# Patient Record
Sex: Male | Born: 1975 | Race: Black or African American | Hispanic: No | Marital: Married | Smoking: Former smoker
Health system: Southern US, Community
[De-identification: ages and names within clinical notes are randomized; demographics above are authoritative.]

## PROBLEM LIST (undated history)

## (undated) DIAGNOSIS — Z789 Other specified health status: Secondary | ICD-10-CM

## (undated) HISTORY — PX: VASECTOMY: SHX75

## (undated) HISTORY — DX: Other specified health status: Z78.9

---

## 2007-08-21 ENCOUNTER — Ambulatory Visit: Payer: Self-pay | Admitting: Internal Medicine

## 2007-08-21 DIAGNOSIS — R109 Unspecified abdominal pain: Secondary | ICD-10-CM

## 2007-08-21 LAB — CONVERTED CEMR LAB
ALT: 16 units/L (ref 0–53)
Albumin: 3.9 g/dL (ref 3.5–5.2)
Basophils Absolute: 0 10*3/uL (ref 0.0–0.1)
Basophils Relative: 0.6 % (ref 0.0–3.0)
CO2: 28 meq/L (ref 19–32)
Calcium: 9.2 mg/dL (ref 8.4–10.5)
Cholesterol: 167 mg/dL (ref 0–200)
Creatinine, Ser: 1.1 mg/dL (ref 0.4–1.5)
Glucose, Bld: 92 mg/dL (ref 70–99)
HCT: 48.3 % (ref 39.0–52.0)
Hemoglobin: 16.7 g/dL (ref 13.0–17.0)
Ketones, ur: NEGATIVE mg/dL
Lymphocytes Relative: 28.7 % (ref 12.0–46.0)
MCHC: 34.5 g/dL (ref 30.0–36.0)
Monocytes Absolute: 0.6 10*3/uL (ref 0.1–1.0)
Monocytes Relative: 10.6 % (ref 3.0–12.0)
Neutro Abs: 3.4 10*3/uL (ref 1.4–7.7)
RBC: 5.02 M/uL (ref 4.22–5.81)
Specific Gravity, Urine: >=1.03 (ref 1.000–1.03)
TSH: 0.88 microintl units/mL (ref 0.35–5.50)
Total CHOL/HDL Ratio: 3.5
Total Protein, Urine: NEGATIVE mg/dL
Total Protein: 6.9 g/dL (ref 6.0–8.3)
Triglycerides: 39 mg/dL (ref 0–149)
pH: 5.5 (ref 5.0–8.0)

## 2007-08-22 LAB — CONVERTED CEMR LAB: Chlamydia, DNA Probe: POSITIVE — AB

## 2008-05-19 ENCOUNTER — Telehealth: Payer: Self-pay | Admitting: Gastroenterology

## 2011-06-08 ENCOUNTER — Other Ambulatory Visit: Payer: Self-pay | Admitting: Student

## 2011-06-08 ENCOUNTER — Ambulatory Visit
Admission: RE | Admit: 2011-06-08 | Discharge: 2011-06-08 | Disposition: A | Discharge status: Home / Self Care | Payer: No Typology Code available for payment source | Source: Ambulatory Visit | Attending: Student | Admitting: Student

## 2011-06-08 DIAGNOSIS — R7611 Nonspecific reaction to tuberculin skin test without active tuberculosis: Secondary | ICD-10-CM

## 2013-05-25 ENCOUNTER — Other Ambulatory Visit (HOSPITAL_COMMUNITY): Payer: Self-pay | Admitting: Family Medicine

## 2013-05-25 DIAGNOSIS — R519 Headache, unspecified: Secondary | ICD-10-CM

## 2013-05-25 DIAGNOSIS — R51 Headache: Principal | ICD-10-CM

## 2013-05-26 IMAGING — CR DG CHEST 2V
2 series · 2 of 2 positions shown · non-contrast
Comparison: None

CLINICAL DATA: Positive PPD

CHEST - 2 VIEW

[w chest pa]
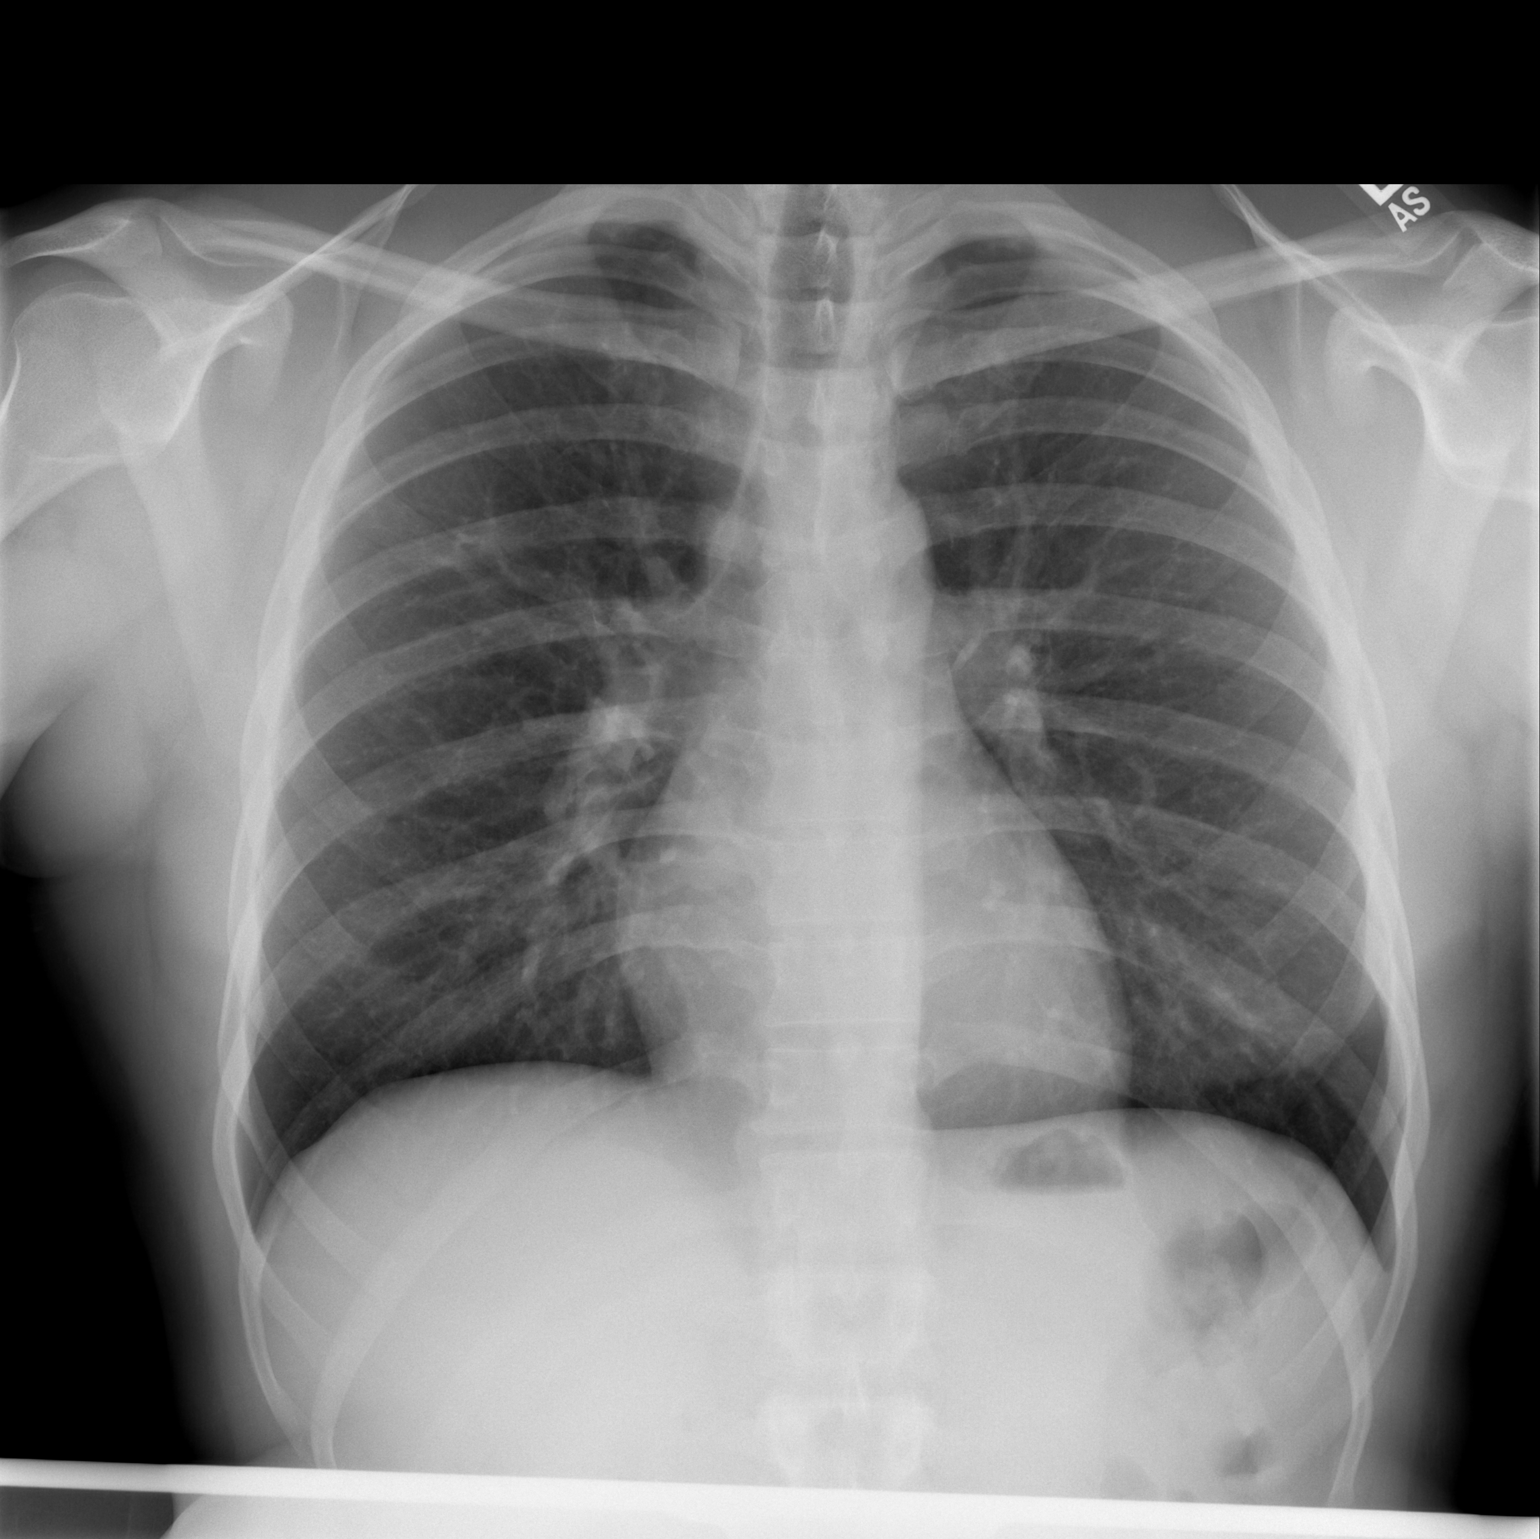

[w chest lat]
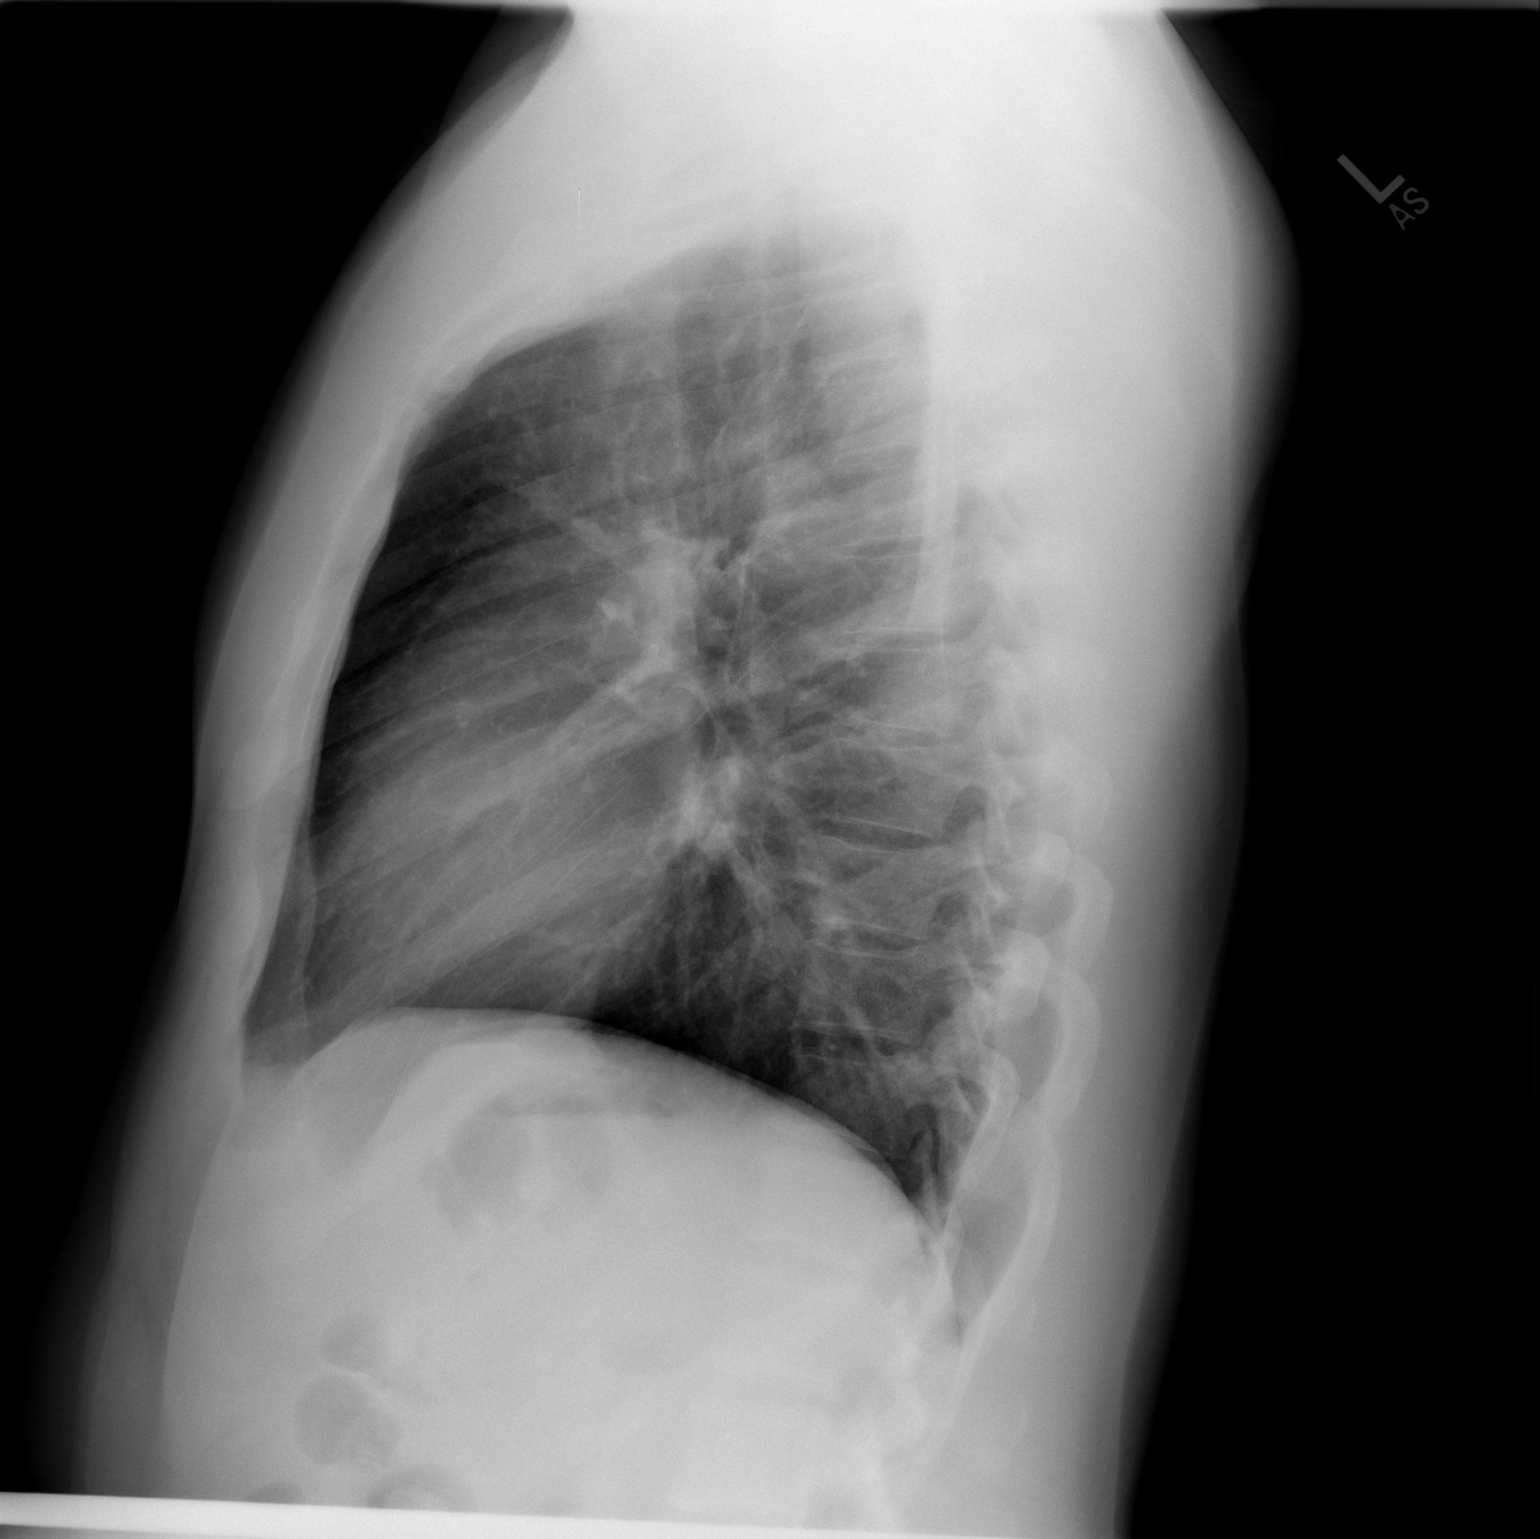

[2 of 2 positions shown; findings below may reference images not displayed]

FINDINGS: The heart size and mediastinal contours are within normal
limits.  Both lungs are clear.  The visualized skeletal structures
are unremarkable.
IMPRESSION: Negative exam.

## 2013-05-27 ENCOUNTER — Ambulatory Visit (HOSPITAL_COMMUNITY)
Admission: RE | Admit: 2013-05-27 | Discharge: 2013-05-27 | Disposition: A | Discharge status: Home / Self Care | Payer: BC Managed Care – PPO | Source: Ambulatory Visit | Attending: Family Medicine | Admitting: Family Medicine

## 2013-05-27 DIAGNOSIS — R42 Dizziness and giddiness: Secondary | ICD-10-CM | POA: Insufficient documentation

## 2013-05-27 DIAGNOSIS — R519 Headache, unspecified: Secondary | ICD-10-CM

## 2013-05-27 DIAGNOSIS — H538 Other visual disturbances: Secondary | ICD-10-CM | POA: Insufficient documentation

## 2013-05-27 DIAGNOSIS — R51 Headache: Secondary | ICD-10-CM | POA: Insufficient documentation

## 2018-09-16 ENCOUNTER — Telehealth: Payer: Self-pay | Admitting: Family Medicine

## 2018-09-16 ENCOUNTER — Other Ambulatory Visit: Payer: Self-pay | Admitting: *Deleted

## 2018-09-16 DIAGNOSIS — Z20822 Contact with and (suspected) exposure to covid-19: Secondary | ICD-10-CM

## 2018-09-16 NOTE — Telephone Encounter (Signed)
Pt states is going for covid 19 testing. States his son in the household has tested positive. Questioning if he should return to work; advised to self isolate until test resulted. Pt verbalizes understanding.

## 2018-09-17 LAB — NOVEL CORONAVIRUS, NAA: SARS-CoV-2, NAA: NOT DETECTED

## 2018-09-18 ENCOUNTER — Other Ambulatory Visit: Payer: Self-pay

## 2018-09-18 DIAGNOSIS — Z20822 Contact with and (suspected) exposure to covid-19: Secondary | ICD-10-CM

## 2018-09-19 LAB — NOVEL CORONAVIRUS, NAA: SARS-CoV-2, NAA: NOT DETECTED

## 2018-09-24 ENCOUNTER — Other Ambulatory Visit: Payer: Self-pay

## 2018-09-24 DIAGNOSIS — Z20822 Contact with and (suspected) exposure to covid-19: Secondary | ICD-10-CM

## 2018-09-25 LAB — NOVEL CORONAVIRUS, NAA: SARS-CoV-2, NAA: NOT DETECTED

## 2018-11-04 ENCOUNTER — Other Ambulatory Visit: Payer: Self-pay

## 2018-11-04 DIAGNOSIS — Z20822 Contact with and (suspected) exposure to covid-19: Secondary | ICD-10-CM

## 2018-11-05 LAB — NOVEL CORONAVIRUS, NAA: SARS-CoV-2, NAA: DETECTED — AB

## 2018-11-13 ENCOUNTER — Other Ambulatory Visit: Payer: Self-pay | Admitting: *Deleted

## 2018-11-13 DIAGNOSIS — Z20822 Contact with and (suspected) exposure to covid-19: Secondary | ICD-10-CM

## 2018-11-14 LAB — NOVEL CORONAVIRUS, NAA: SARS-CoV-2, NAA: DETECTED — AB

## 2021-05-22 DIAGNOSIS — Z6828 Body mass index (BMI) 28.0-28.9, adult: Secondary | ICD-10-CM | POA: Diagnosis not present

## 2021-05-22 DIAGNOSIS — E785 Hyperlipidemia, unspecified: Secondary | ICD-10-CM | POA: Diagnosis not present

## 2021-05-22 DIAGNOSIS — K625 Hemorrhage of anus and rectum: Secondary | ICD-10-CM | POA: Diagnosis not present

## 2021-05-22 DIAGNOSIS — Z125 Encounter for screening for malignant neoplasm of prostate: Secondary | ICD-10-CM | POA: Diagnosis not present

## 2021-05-22 DIAGNOSIS — K649 Unspecified hemorrhoids: Secondary | ICD-10-CM | POA: Diagnosis not present

## 2021-06-08 ENCOUNTER — Ambulatory Visit: Payer: No Typology Code available for payment source | Admitting: Gastroenterology

## 2021-07-17 DIAGNOSIS — K649 Unspecified hemorrhoids: Secondary | ICD-10-CM | POA: Diagnosis not present

## 2021-07-17 DIAGNOSIS — E785 Hyperlipidemia, unspecified: Secondary | ICD-10-CM | POA: Diagnosis not present

## 2021-07-17 DIAGNOSIS — K625 Hemorrhage of anus and rectum: Secondary | ICD-10-CM | POA: Diagnosis not present

## 2022-05-21 DIAGNOSIS — E7800 Pure hypercholesterolemia, unspecified: Secondary | ICD-10-CM | POA: Diagnosis not present

## 2022-05-21 DIAGNOSIS — K649 Unspecified hemorrhoids: Secondary | ICD-10-CM | POA: Diagnosis not present

## 2022-06-01 ENCOUNTER — Encounter (INDEPENDENT_AMBULATORY_CARE_PROVIDER_SITE_OTHER): Payer: Self-pay | Admitting: *Deleted

## 2022-06-05 ENCOUNTER — Ambulatory Visit: Payer: No Typology Code available for payment source | Admitting: Gastroenterology

## 2022-06-06 ENCOUNTER — Encounter: Payer: Self-pay | Admitting: *Deleted

## 2022-06-06 ENCOUNTER — Encounter: Payer: Self-pay | Admitting: Gastroenterology

## 2022-06-06 ENCOUNTER — Ambulatory Visit: Payer: No Typology Code available for payment source | Admitting: Gastroenterology

## 2022-06-06 VITALS — BP 132/88 | HR 65 | Temp 98.4°F | Ht 70.5 in | Wt 200.5 lb

## 2022-06-06 DIAGNOSIS — Z1211 Encounter for screening for malignant neoplasm of colon: Secondary | ICD-10-CM | POA: Insufficient documentation

## 2022-06-06 DIAGNOSIS — K648 Other hemorrhoids: Secondary | ICD-10-CM

## 2022-06-06 NOTE — Progress Notes (Signed)
Gastroenterology Office Note    Referring Provider: Mirna Mires, MD Primary Care Physician:  Mirna Mires, MD  Primary GI: Dr. Marletta Lor    Chief Complaint   Chief Complaint  Patient presents with   Blood In Stools    Patient here today due to seeing bright red blood in stools. He has seen the blood as recent as two days ago. Patient states he has a history of Hemorrhoids.     History of Present Illness   Connor Wong is a 47 y.o. male presenting today at the request of Mirna Mires, MD due to long-standing hemorrhoids.    History of hemorrhoids throughout his life. 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 Over a year. Painless. No constipation currently. BM every other day is his baseline. Productive stools. Prolonged toilet time. No straining. No abdominal pain. No weight loss or lack of appetite.    Past Medical History:  Diagnosis Date   Medical history non-contributory     Past Surgical History:  Procedure Laterality Date   VASECTOMY      Current Outpatient Medications  Medication Sig Dispense Refill   Multiple Vitamin (MULTIVITAMIN) capsule Take 1 capsule by mouth daily.     OVER THE COUNTER MEDICATION Black seed oil two times a week.   Tumeric once two times a week.     No current facility-administered medications for this visit.    Allergies as of 06/06/2022 - Review Complete 06/06/2022  Allergen Reaction Noted   Penicillins  06/06/2022    Family History  Problem Relation Age of Onset   Healthy Father    Colon cancer Neg Hx    Colon polyps Neg Hx     Social History   Socioeconomic History   Marital status: Married    Spouse name: Not on file   Number of children: Not on file   Years of education: Not on file   Highest education level: Not on file  Occupational History   Not on file  Tobacco Use   Smoking status: Former    Types:  Cigarettes   Smokeless tobacco: Never  Vaping Use   Vaping Use: Every day  Substance and Sexual Activity   Alcohol use: Yes    Comment: occasional   Drug use: Never   Sexual activity: Not on file  Other Topics Concern   Not on file  Social History Narrative   Not on file   Social Determinants of Health   Financial Resource Strain: Not on file  Food Insecurity: Not on file  Transportation Needs: Not on file  Physical Activity: Not on file  Stress: Not on file  Social Connections: Not on file  Intimate Partner Violence: Not on file     Review of Systems   Gen: Denies any fever, chills, fatigue, weight loss, lack of appetite.  CV: Denies chest pain, heart palpitations, peripheral edema, syncope.  Resp: Denies shortness of breath at rest or with exertion. Denies wheezing or cough.  GI: Denies dysphagia or odynophagia. Denies jaundice, hematemesis, fecal incontinence. GU : Denies urinary burning, urinary frequency, urinary hesitancy MS: Denies joint pain, muscle weakness, cramps, or limitation of movement.  Derm: Denies rash, itching, dry skin Psych: Denies depression, anxiety, memory loss, and confusion Heme: Denies bruising, bleeding, and enlarged lymph nodes.   Physical Exam   BP 132/88 (BP Location: Left Arm, Patient Position: Sitting, Cuff  Size: Normal)   Pulse 65   Temp 98.4 F (36.9 C) (Temporal)   Ht 5' 10.5" (1.791 m)   Wt 200 lb 8 oz (90.9 kg)   BMI 28.36 kg/m  General:   Alert and oriented. Pleasant and cooperative. Well-nourished and well-developed.  Head:  Normocephalic and atraumatic. Eyes:  Without icterus Ears:  Normal auditory acuity. Lungs:  Clear to auscultation bilaterally.  Heart:  S1, S2 present without murmurs appreciated.  Abdomen:  +BS, soft, non-tender and non-distended. No HSM noted. No guarding or rebound. No masses appreciated.  Rectal:  without mass. Prominent internal hemorrhoid columns. No pain.  Msk:  Symmetrical without gross  deformities. Normal posture. Extremities:  Without edema. Neurologic:  Alert and  oriented x4;  grossly normal neurologically. Skin:  Intact without significant lesions or rashes. Psych:  Alert and cooperative. Normal mood and affect.   Assessment   Connor Wong is a 46 y.o. male in great health who is presenting in consultation at request of Dr. Loleta Chance due to chronic hemorrhoids.   He has had long-standing symptoms attributed to hemorrhoids, with painless intermittent low-volume hematochezia over the past year. Rectal exam today with likely internal hemorrhoids; no anoscopy performed. No mass.  He will be due regardless for screening colonoscopy, as he turns 47 in a few days. No FH colon cancer or polyps. We will arrange colonoscopy in the near future.  I briefly discussed hemorrhoid banding with him. He is unsure if he wants to pursue this.    PLAN   Proceed with colonoscopy by Dr. Marletta Lor  in near future: the risks, benefits, and alternatives have been discussed with the patient in detail. The patient states understanding and desires to proceed. ASA 2  Avoid prolonged toilet time. Continue to avoid straining  Consider hemorrhoid banding thereafter  Gelene Mink, PhD, ANP-BC [facility]

## 2022-06-06 NOTE — Patient Instructions (Signed)
We are arranging a colonoscopy with Dr. Marletta Lor in the near future!  Avoid sitting for more than 2-3 minutes on the toilet. Avoid straining.  We can consider hemorrhoid banding in the future if you are interested in this!  Happy Birthday!  It was a pleasure to see you today. I want to create trusting relationships with patients and provide genuine, compassionate, and quality care. I truly value your feedback, so please be on the lookout for a survey regarding your visit with me today. I appreciate your time in completing this!    Gelene Mink, PhD, ANP-BC [facility]

## 2022-06-06 NOTE — H&P (View-Only) (Signed)
  Gastroenterology Office Note    Referring Provider: Hill, Gerald, MD Primary Care Physician:  Hill, Gerald, MD  Primary GI: Dr. Carver    Chief Complaint   Chief Complaint  Patient presents with   Blood In Stools    Patient here today due to seeing bright red blood in stools. He has seen the blood as recent as two days ago. Patient states he has a history of Hemorrhoids.     History of Present Illness   Connor Wong is a 46 y.o. male presenting today at the request of Hill, Gerald, MD due to long-standing hemorrhoids.    History of hemorrhoids throughout his life. 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 Over a year. Painless. No constipation currently. BM every other day is his baseline. Productive stools. Prolonged toilet time. No straining. No abdominal pain. No weight loss or lack of appetite.    Past Medical History:  Diagnosis Date   Medical history non-contributory     Past Surgical History:  Procedure Laterality Date   VASECTOMY      Current Outpatient Medications  Medication Sig Dispense Refill   Multiple Vitamin (MULTIVITAMIN) capsule Take 1 capsule by mouth daily.     OVER THE COUNTER MEDICATION Black seed oil two times a week.   Tumeric once two times a week.     No current facility-administered medications for this visit.    Allergies as of 06/06/2022 - Review Complete 06/06/2022  Allergen Reaction Noted   Penicillins  06/06/2022    Family History  Problem Relation Age of Onset   Healthy Father    Colon cancer Neg Hx    Colon polyps Neg Hx     Social History   Socioeconomic History   Marital status: Married    Spouse name: Not on file   Number of children: Not on file   Years of education: Not on file   Highest education level: Not on file  Occupational History   Not on file  Tobacco Use   Smoking status: Former    Types:  Cigarettes   Smokeless tobacco: Never  Vaping Use   Vaping Use: Every day  Substance and Sexual Activity   Alcohol use: Yes    Comment: occasional   Drug use: Never   Sexual activity: Not on file  Other Topics Concern   Not on file  Social History Narrative   Not on file   Social Determinants of Health   Financial Resource Strain: Not on file  Food Insecurity: Not on file  Transportation Needs: Not on file  Physical Activity: Not on file  Stress: Not on file  Social Connections: Not on file  Intimate Partner Violence: Not on file     Review of Systems   Gen: Denies any fever, chills, fatigue, weight loss, lack of appetite.  CV: Denies chest pain, heart palpitations, peripheral edema, syncope.  Resp: Denies shortness of breath at rest or with exertion. Denies wheezing or cough.  GI: Denies dysphagia or odynophagia. Denies jaundice, hematemesis, fecal incontinence. GU : Denies urinary burning, urinary frequency, urinary hesitancy MS: Denies joint pain, muscle weakness, cramps, or limitation of movement.  Derm: Denies rash, itching, dry skin Psych: Denies depression, anxiety, memory loss, and confusion Heme: Denies bruising, bleeding, and enlarged lymph nodes.   Physical Exam   BP 132/88 (BP Location: Left Arm, Patient Position: Sitting, Cuff   Size: Normal)   Pulse 65   Temp 98.4 F (36.9 C) (Temporal)   Ht 5' 10.5" (1.791 m)   Wt 200 lb 8 oz (90.9 kg)   BMI 28.36 kg/m  General:   Alert and oriented. Pleasant and cooperative. Well-nourished and well-developed.  Head:  Normocephalic and atraumatic. Eyes:  Without icterus Ears:  Normal auditory acuity. Lungs:  Clear to auscultation bilaterally.  Heart:  S1, S2 present without murmurs appreciated.  Abdomen:  +BS, soft, non-tender and non-distended. No HSM noted. No guarding or rebound. No masses appreciated.  Rectal:  without mass. Prominent internal hemorrhoid columns. No pain.  Msk:  Symmetrical without gross  deformities. Normal posture. Extremities:  Without edema. Neurologic:  Alert and  oriented x4;  grossly normal neurologically. Skin:  Intact without significant lesions or rashes. Psych:  Alert and cooperative. Normal mood and affect.   Assessment   Connor Wong is a 46 y.o. male in great health who is presenting in consultation at request of Dr. Hill due to chronic hemorrhoids.   He has had long-standing symptoms attributed to hemorrhoids, with painless intermittent low-volume hematochezia over the past year. Rectal exam today with likely internal hemorrhoids; no anoscopy performed. No mass.  He will be due regardless for screening colonoscopy, as he turns 47 in a few days. No FH colon cancer or polyps. We will arrange colonoscopy in the near future.  I briefly discussed hemorrhoid banding with him. He is unsure if he wants to pursue this.    PLAN   Proceed with colonoscopy by Dr. Carver  in near future: the risks, benefits, and alternatives have been discussed with the patient in detail. The patient states understanding and desires to proceed. ASA 2  Avoid prolonged toilet time. Continue to avoid straining  Consider hemorrhoid banding thereafter  Edie Vallandingham W. Faron Tudisco, PhD, ANP-BC [facility]    

## 2022-07-03 ENCOUNTER — Encounter (HOSPITAL_COMMUNITY): Payer: Self-pay

## 2022-07-03 ENCOUNTER — Encounter (HOSPITAL_COMMUNITY)
Admission: RE | Admit: 2022-07-03 | Discharge: 2022-07-03 | Disposition: A | Discharge status: Home / Self Care | Payer: BC Managed Care – PPO | Source: Ambulatory Visit | Attending: Internal Medicine | Admitting: Internal Medicine

## 2022-07-05 ENCOUNTER — Encounter (HOSPITAL_COMMUNITY)
Admission: RE | Disposition: A | Discharge status: Home / Self Care | Payer: Self-pay | Source: Home / Self Care | Attending: Internal Medicine

## 2022-07-05 ENCOUNTER — Ambulatory Visit (HOSPITAL_COMMUNITY): Payer: BC Managed Care – PPO | Admitting: Certified Registered"

## 2022-07-05 ENCOUNTER — Encounter (HOSPITAL_COMMUNITY): Payer: Self-pay

## 2022-07-05 ENCOUNTER — Ambulatory Visit (HOSPITAL_COMMUNITY)
Admission: RE | Admit: 2022-07-05 | Discharge: 2022-07-05 | Disposition: A | Discharge status: Home / Self Care | Payer: BC Managed Care – PPO | Attending: Internal Medicine | Admitting: Internal Medicine

## 2022-07-05 DIAGNOSIS — K625 Hemorrhage of anus and rectum: Secondary | ICD-10-CM

## 2022-07-05 DIAGNOSIS — K6289 Other specified diseases of anus and rectum: Secondary | ICD-10-CM | POA: Diagnosis not present

## 2022-07-05 DIAGNOSIS — Z09 Encounter for follow-up examination after completed treatment for conditions other than malignant neoplasm: Secondary | ICD-10-CM | POA: Diagnosis not present

## 2022-07-05 DIAGNOSIS — K649 Unspecified hemorrhoids: Secondary | ICD-10-CM | POA: Insufficient documentation

## 2022-07-05 DIAGNOSIS — Z87891 Personal history of nicotine dependence: Secondary | ICD-10-CM | POA: Diagnosis not present

## 2022-07-05 DIAGNOSIS — K529 Noninfective gastroenteritis and colitis, unspecified: Secondary | ICD-10-CM | POA: Diagnosis not present

## 2022-07-05 DIAGNOSIS — Z1211 Encounter for screening for malignant neoplasm of colon: Secondary | ICD-10-CM | POA: Diagnosis not present

## 2022-07-05 DIAGNOSIS — Z8719 Personal history of other diseases of the digestive system: Secondary | ICD-10-CM | POA: Diagnosis not present

## 2022-07-05 HISTORY — PX: COLONOSCOPY WITH PROPOFOL: SHX5780

## 2022-07-05 HISTORY — PX: BIOPSY: SHX5522

## 2022-07-05 SURGERY — COLONOSCOPY WITH PROPOFOL
Anesthesia: General

## 2022-07-05 MED ORDER — DEXMEDETOMIDINE HCL IN NACL 80 MCG/20ML IV SOLN
INTRAVENOUS | Status: DC | PRN
  Administered 2022-07-05: 8 ug via INTRAVENOUS
  Administered 2022-07-05: 12 ug via INTRAVENOUS

## 2022-07-05 MED ORDER — LIDOCAINE HCL (CARDIAC) PF 100 MG/5ML IV SOSY
PREFILLED_SYRINGE | INTRAVENOUS | Status: DC | PRN
  Administered 2022-07-05: 50 mg via INTRAVENOUS

## 2022-07-05 MED ORDER — LACTATED RINGERS IV SOLN: INTRAVENOUS | Status: DC | PRN

## 2022-07-05 MED ORDER — PROPOFOL 10 MG/ML IV BOLUS
INTRAVENOUS | Status: DC | PRN
  Administered 2022-07-05: 70 mg via INTRAVENOUS
  Administered 2022-07-05: 130 mg via INTRAVENOUS
  Administered 2022-07-05 (×2): 50 mg via INTRAVENOUS

## 2022-07-05 NOTE — Transfer of Care (Signed)
Immediate Anesthesia Transfer of Care Note  Patient: Connor Wong  Procedure(s) Performed: COLONOSCOPY WITH PROPOFOL BIOPSY  Patient Location: Short Stay  Anesthesia Type:General  Level of Consciousness: drowsy  Airway & Oxygen Therapy: Patient Spontanous Breathing  Post-op Assessment: Report given to RN and Post -op Vital signs reviewed and stable  Post vital signs: Reviewed and stable  Last Vitals:  Vitals Value Taken Time  BP 97/53 07/05/22 1011  Temp    Pulse 62 07/05/22 1011  Resp 19 07/05/22 1011  SpO2 99 % 07/05/22 1011    Last Pain:  Vitals:   07/05/22 1011  PainSc: Asleep         Complications: No notable events documented.

## 2022-07-05 NOTE — Interval H&P Note (Signed)
History and Physical Interval Note:  07/05/2022 9:38 AM  Connor Wong  has presented today for surgery, with the diagnosis of screening.  The various methods of treatment have been discussed with the patient and family. After consideration of risks, benefits and other options for treatment, the patient has consented to  Procedure(s) with comments: COLONOSCOPY WITH PROPOFOL (N/A) - 10:15am, asa 2 as a surgical intervention.  The patient's history has been reviewed, patient examined, no change in status, stable for surgery.  I have reviewed the patient's chart and labs.  Questions were answered to the patient's satisfaction.     Lanelle Bal

## 2022-07-05 NOTE — Discharge Instructions (Addendum)
  Colonoscopy Discharge Instructions  Read the instructions outlined below and refer to this sheet in the next few weeks. These discharge instructions provide you with general information on caring for yourself after you leave the hospital. Your doctor may also give you specific instructions. While your treatment has been planned according to the most current medical practices available, unavoidable complications occasionally occur.   ACTIVITY You may resume your regular activity, but move at a slower pace for the next 24 hours.  Take frequent rest periods for the next 24 hours.  Walking will help get rid of the air and reduce the bloated feeling in your belly (abdomen).  No driving for 24 hours (because of the medicine (anesthesia) used during the test).   Do not sign any important legal documents or operate any machinery for 24 hours (because of the anesthesia used during the test).  NUTRITION Drink plenty of fluids.  You may resume your normal diet as instructed by your doctor.  Begin with a light meal and progress to your normal diet. Heavy or fried foods are harder to digest and may make you feel sick to your stomach (nauseated).  Avoid alcoholic beverages for 24 hours or as instructed.  MEDICATIONS You may resume your normal medications unless your doctor tells you otherwise.  WHAT YOU CAN EXPECT TODAY Some feelings of bloating in the abdomen.  Passage of more gas than usual.  Spotting of blood in your stool or on the toilet paper.  IF YOU HAD POLYPS REMOVED DURING THE COLONOSCOPY: No aspirin products for 7 days or as instructed.  No alcohol for 7 days or as instructed.  Eat a soft diet for the next 24 hours.  FINDING OUT THE RESULTS OF YOUR TEST Not all test results are available during your visit. If your test results are not back during the visit, make an appointment with your caregiver to find out the results. Do not assume everything is normal if you have not heard from your  caregiver or the medical facility. It is important for you to follow up on all of your test results.  SEEK IMMEDIATE MEDICAL ATTENTION IF: You have more than a spotting of blood in your stool.  Your belly is swollen (abdominal distention).  You are nauseated or vomiting.  You have a temperature over 101.  You have abdominal pain or discomfort that is severe or gets worse throughout the day.   Your colonoscopy revealed inflammation in your rectum and on the right side your colon.  I took biopsies of both areas.  Await pathology results, office will contact you to determine next steps.  I did not find any polyps.  Follow-up in GI office in 2 months  I hope you have a great rest of your week!  Hennie Duos. Marletta Lor, D.O. Gastroenterology and Hepatology [facility]

## 2022-07-05 NOTE — Anesthesia Procedure Notes (Signed)
Date/Time: 07/05/2022 9:51 AM  Performed by: Julian Reil, CRNAPre-anesthesia Checklist: Patient identified, Emergency Drugs available, Patient being monitored and Suction available Patient Re-evaluated:Patient Re-evaluated prior to induction Oxygen Delivery Method: Nasal cannula Induction Type: IV induction Placement Confirmation: positive ETCO2

## 2022-07-05 NOTE — Anesthesia Preprocedure Evaluation (Addendum)
Anesthesia Evaluation  Patient identified by MRN, date of birth, ID band Patient awake    Reviewed: Allergy & Precautions, H&P , NPO status , Patient's Chart, lab work & pertinent test results, reviewed documented beta blocker date and time   Airway Mallampati: II  TM Distance: >3 FB Neck ROM: full    Dental no notable dental hx.    Pulmonary Patient abstained from smoking., former smoker   Pulmonary exam normal breath sounds clear to auscultation       Cardiovascular Exercise Tolerance: Good negative cardio ROS  Rhythm:regular Rate:Normal     Neuro/Psych negative neurological ROS  negative psych ROS   GI/Hepatic negative GI ROS, Neg liver ROS,,,  Endo/Other  negative endocrine ROS    Renal/GU negative Renal ROS  negative genitourinary   Musculoskeletal   Abdominal   Peds  Hematology negative hematology ROS (+)   Anesthesia Other Findings   Reproductive/Obstetrics negative OB ROS                              Anesthesia Physical Anesthesia Plan  ASA: 1  Anesthesia Plan: General   Post-op Pain Management:    Induction:   PONV Risk Score and Plan: Propofol infusion  Airway Management Planned:   Additional Equipment:   Intra-op Plan:   Post-operative Plan:   Informed Consent: I have reviewed the patients History and Physical, chart, labs and discussed the procedure including the risks, benefits and alternatives for the proposed anesthesia with the patient or authorized representative who has indicated his/her understanding and acceptance.     Dental Advisory Given  Plan Discussed with: CRNA  Anesthesia Plan Comments:         Anesthesia Quick Evaluation

## 2022-07-05 NOTE — Op Note (Signed)
[facility] Patient Name: Connor Wong Procedure Date: 07/05/2022 9:33 AM MRN: [id number] Date of Birth: [birth year] Attending MD: Hennie Duos. Marletta Lor , [place], [id number] CSN: [id number] Age: 47 Admit Type: Outpatient Procedure:                Colonoscopy Indications:              Rectal bleeding Providers:                Hennie Duos. Marletta Lor, DO, Jessica Boudreaux, Crystal                            Page, Zena Amos, Dyann Ruddle Referring MD:              Medicines:                See the Anesthesia note for documentation of the                            administered medications Complications:            No immediate complications. Estimated Blood Loss:     Estimated blood loss was minimal. Procedure:                Pre-Anesthesia Assessment:                           - The anesthesia plan was to use monitored                            anesthesia care (MAC).                           After obtaining informed consent, the colonoscope                            was passed under direct vision. Throughout the                            procedure, the patient's blood pressure, pulse, and                            oxygen saturations were monitored continuously. The                            [id number]-HQ190L ([id number]) scope was introduced through                            the anus and advanced to the the cecum, identified                            by appendiceal orifice and ileocecal valve. The                            colonoscopy was performed without difficulty. The                            patient tolerated the procedure well. The  quality                            of the bowel preparation was evaluated using the                            BBPS [facility] Bowel Preparation Scale) with scores                            of: Right Colon = 3, Transverse Colon = 3 and Left                            Colon = 3 (entire mucosa seen well with no residual                            staining,  small fragments of stool or opaque                            liquid). The total BBPS score equals 9. Scope In: 9:50:44 AM Scope Out: 10:07:39 AM Scope Withdrawal Time: 0 hours 14 minutes 1 second  Total Procedure Duration: 0 hours 16 minutes 55 seconds  Findings:      Localized mild inflammation characterized by erosions, erythema and       friability was found in the rectum. Biopsies were taken with a cold       forceps for histology.      Localized moderate inflammation characterized by congestion (edema),       erosions and erythema was found in the ascending colon. Biopsies were       taken with a cold forceps for histology.      The terminal ileum appeared normal. Remainder of colon appeared healthy       without inflammation Impression:               - Localized mild inflammation was found in the                            rectum secondary to proctitis. Biopsied.                           - Localized moderate inflammation was found in the                            ascending colon. Biopsied.                           - The examined portion of the ileum was normal. Moderate Sedation:      Per Anesthesia Care Recommendation:           - Patient has a contact number available for                            emergencies. The signs and symptoms of potential                            delayed complications were discussed with the  patient. Return to normal activities tomorrow.                            Written discharge instructions were provided to the                            patient.                           - Resume previous diet.                           - Continue present medications.                           - Await pathology results.                           - Repeat colonoscopy date to be determined after                            pending pathology results are reviewed for                            surveillance based on pathology results.                            - Return to GI clinic in 2 months. Procedure Code(s):        --- Professional ---                           [phone], Colonoscopy, flexible; with biopsy, single                            or multiple Diagnosis Code(s):        --- Professional ---                           K62.89, Other specified diseases of anus and rectum                           K52.9, Noninfective gastroenteritis and colitis,                            unspecified                           K62.5, Hemorrhage of anus and rectum CPT copyright 2022 American Medical Association. All rights reserved. The codes documented in this report are preliminary and upon coder review may  be revised to meet current compliance requirements. Hennie Duos. Marletta Lor, DO Hennie Duos. Yaniv Lage, DO 07/05/2022 10:11:42 AM This report has been signed electronically. Number of Addenda: 0

## 2022-07-06 LAB — SURGICAL PATHOLOGY

## 2022-07-08 ENCOUNTER — Telehealth: Payer: Self-pay | Admitting: Internal Medicine

## 2022-07-08 MED ORDER — BUDESONIDE 3 MG PO CPEP: ORAL_CAPSULE | ORAL | 0 refills | Status: AC

## 2022-07-08 NOTE — Telephone Encounter (Signed)
Please let patient know his biopsies are consistent with inflammatory bowel disease.  Given the distribution of rectum and ascending colon, likely this is Crohn's colitis.  I will send in a 74-month course of budesonide to try to induce remission.  3 tablets daily x 8 weeks then 2 tablets daily x 2 weeks then 1 tablet daily x 2 weeks.  Follow-up as previously scheduled to discuss further.  Thank you

## 2022-07-09 NOTE — Telephone Encounter (Signed)
Phoned and LMOVM for the pt to return call 

## 2022-07-10 ENCOUNTER — Encounter: Payer: Self-pay | Admitting: Gastroenterology

## 2022-07-10 NOTE — Telephone Encounter (Signed)
Phoned the pt and advised of his result note, medication sent in with the instructions, how long to take and to call after completion for a follow up appt. Pt expressed understanding

## 2022-07-11 NOTE — Anesthesia Postprocedure Evaluation (Signed)
Anesthesia Post Note  Patient: Connor Wong  Procedure(s) Performed: COLONOSCOPY WITH PROPOFOL BIOPSY  Patient location during evaluation: Phase II Anesthesia Type: General Level of consciousness: awake Pain management: pain level controlled Vital Signs Assessment: post-procedure vital signs reviewed and stable Respiratory status: spontaneous breathing and respiratory function stable Cardiovascular status: blood pressure returned to baseline and stable Postop Assessment: no headache and no apparent nausea or vomiting Anesthetic complications: no Comments: Late entry   No notable events documented.   Last Vitals:  Vitals:   07/05/22 0841 07/05/22 1011  BP: (!) 135/96 (!) 97/53  Pulse: (!) 52 62  Resp: 18 19  Temp: 36.9 C   SpO2: 99% 99%    Last Pain:  Vitals:   07/06/22 1514  PainSc: 0-No pain                 Windell Norfolk

## 2022-07-16 ENCOUNTER — Encounter (HOSPITAL_COMMUNITY): Payer: Self-pay | Admitting: Internal Medicine

## 2022-10-27 DIAGNOSIS — K649 Unspecified hemorrhoids: Secondary | ICD-10-CM | POA: Diagnosis not present

## 2022-10-27 DIAGNOSIS — E7800 Pure hypercholesterolemia, unspecified: Secondary | ICD-10-CM | POA: Diagnosis not present
